# Patient Record
Sex: Male | Born: 1955 | Race: White | Hispanic: No | Marital: Married | Smoking: Former smoker
Health system: Southern US, Community
[De-identification: ages and names within clinical notes are randomized; demographics above are authoritative.]

## PROBLEM LIST (undated history)

## (undated) DIAGNOSIS — I10 Essential (primary) hypertension: Secondary | ICD-10-CM

## (undated) DIAGNOSIS — K7460 Unspecified cirrhosis of liver: Secondary | ICD-10-CM

## (undated) DIAGNOSIS — R188 Other ascites: Secondary | ICD-10-CM

## (undated) DIAGNOSIS — E7800 Pure hypercholesterolemia, unspecified: Secondary | ICD-10-CM

---

## 2013-10-06 ENCOUNTER — Emergency Department (HOSPITAL_COMMUNITY)
Admission: EM | Admit: 2013-10-06 | Discharge: 2013-10-06 | Disposition: A | Discharge status: Home / Self Care | Payer: BC Managed Care – PPO | Attending: Emergency Medicine | Admitting: Emergency Medicine

## 2013-10-06 ENCOUNTER — Encounter (HOSPITAL_COMMUNITY): Payer: Self-pay | Admitting: Emergency Medicine

## 2013-10-06 DIAGNOSIS — R188 Other ascites: Secondary | ICD-10-CM | POA: Insufficient documentation

## 2013-10-06 DIAGNOSIS — Z791 Long term (current) use of non-steroidal anti-inflammatories (NSAID): Secondary | ICD-10-CM | POA: Insufficient documentation

## 2013-10-06 DIAGNOSIS — E7800 Pure hypercholesterolemia, unspecified: Secondary | ICD-10-CM | POA: Insufficient documentation

## 2013-10-06 DIAGNOSIS — I10 Essential (primary) hypertension: Secondary | ICD-10-CM | POA: Insufficient documentation

## 2013-10-06 DIAGNOSIS — Z79899 Other long term (current) drug therapy: Secondary | ICD-10-CM | POA: Insufficient documentation

## 2013-10-06 DIAGNOSIS — R05 Cough: Secondary | ICD-10-CM | POA: Insufficient documentation

## 2013-10-06 DIAGNOSIS — R059 Cough, unspecified: Secondary | ICD-10-CM | POA: Insufficient documentation

## 2013-10-06 DIAGNOSIS — Z87891 Personal history of nicotine dependence: Secondary | ICD-10-CM | POA: Insufficient documentation

## 2013-10-06 DIAGNOSIS — K7031 Alcoholic cirrhosis of liver with ascites: Secondary | ICD-10-CM

## 2013-10-06 DIAGNOSIS — K7030 Alcoholic cirrhosis of liver without ascites: Secondary | ICD-10-CM | POA: Insufficient documentation

## 2013-10-06 HISTORY — DX: Pure hypercholesterolemia, unspecified: E78.00

## 2013-10-06 HISTORY — DX: Unspecified cirrhosis of liver: K74.60

## 2013-10-06 HISTORY — DX: Essential (primary) hypertension: I10

## 2013-10-06 HISTORY — DX: Other ascites: R18.8

## 2013-10-06 LAB — BODY FLUID CELL COUNT WITH DIFFERENTIAL
Lymphs, Fluid: 47 %
Monocyte-Macrophage-Serous Fluid: 46 % — ABNORMAL LOW (ref 50–90)
Neutrophil Count, Fluid: 7 % (ref 0–25)
Total Nucleated Cell Count, Fluid: 183 cu mm (ref 0–1000)

## 2013-10-06 LAB — CBC WITH DIFFERENTIAL/PLATELET
Basophils Absolute: 0.1 10*3/uL (ref 0.0–0.1)
Basophils Relative: 1 % (ref 0–1)
EOS ABS: 0.1 10*3/uL (ref 0.0–0.7)
Eosinophils Relative: 1 % (ref 0–5)
HCT: 34.6 % — ABNORMAL LOW (ref 39.0–52.0)
Hemoglobin: 12.1 g/dL — ABNORMAL LOW (ref 13.0–17.0)
LYMPHS ABS: 1.7 10*3/uL (ref 0.7–4.0)
Lymphocytes Relative: 18 % (ref 12–46)
MCH: 34.4 pg — AB (ref 26.0–34.0)
MCHC: 35 g/dL (ref 30.0–36.0)
MCV: 98.3 fL (ref 78.0–100.0)
Monocytes Absolute: 0.8 10*3/uL (ref 0.1–1.0)
Monocytes Relative: 8 % (ref 3–12)
NEUTROS ABS: 7.1 10*3/uL (ref 1.7–7.7)
NEUTROS PCT: 72 % (ref 43–77)
PLATELETS: 196 10*3/uL (ref 150–400)
RBC: 3.52 MIL/uL — AB (ref 4.22–5.81)
RDW: 13.9 % (ref 11.5–15.5)
WBC: 9.7 10*3/uL (ref 4.0–10.5)

## 2013-10-06 LAB — COMPREHENSIVE METABOLIC PANEL
ALBUMIN: 3.5 g/dL (ref 3.5–5.2)
ALK PHOS: 170 U/L — AB (ref 39–117)
ALT: 15 U/L (ref 0–53)
AST: 23 U/L (ref 0–37)
Anion gap: 13 (ref 5–15)
BILIRUBIN TOTAL: 0.9 mg/dL (ref 0.3–1.2)
BUN: 10 mg/dL (ref 6–23)
CHLORIDE: 94 meq/L — AB (ref 96–112)
CO2: 24 mEq/L (ref 19–32)
Calcium: 9.6 mg/dL (ref 8.4–10.5)
Creatinine, Ser: 0.59 mg/dL (ref 0.50–1.35)
GFR calc Af Amer: >90 mL/min (ref >90)
GFR calc non Af Amer: >90 mL/min (ref >90)
GLUCOSE: 102 mg/dL — AB (ref 70–99)
POTASSIUM: 4.1 meq/L (ref 3.7–5.3)
Sodium: 131 mEq/L — ABNORMAL LOW (ref 137–147)
Total Protein: 7 g/dL (ref 6.0–8.3)

## 2013-10-06 LAB — URINALYSIS, ROUTINE W REFLEX MICROSCOPIC
Bilirubin Urine: NEGATIVE
Glucose, UA: NEGATIVE mg/dL
Hgb urine dipstick: NEGATIVE
Ketones, ur: NEGATIVE mg/dL
Leukocytes, UA: NEGATIVE
Nitrite: NEGATIVE
PROTEIN: NEGATIVE mg/dL
Specific Gravity, Urine: 1.023 (ref 1.005–1.030)
UROBILINOGEN UA: 1 mg/dL (ref 0.0–1.0)
pH: 5.5 (ref 5.0–8.0)

## 2013-10-06 LAB — GRAM STAIN: SPECIAL REQUESTS: NORMAL

## 2013-10-06 LAB — ALBUMIN, FLUID (OTHER): Albumin, Fluid: 1.8 g/dL

## 2013-10-06 MED ORDER — LIDOCAINE-EPINEPHRINE 2 %-1:100000 IJ SOLN
INTRAMUSCULAR | Status: AC
  Administered 2013-10-06: 1 mL
  Filled 2013-10-06: qty 1

## 2013-10-06 NOTE — ED Notes (Signed)
[facility], state they will send someone to pick up Pt.

## 2013-10-06 NOTE — Discharge Instructions (Signed)
Ascites °Ascites is a gathering of fluid in the belly (abdomen). This is most often caused by liver disease. It may also be caused by a number of other less common problems. It causes a ballooning out (distension) of the abdomen. °CAUSES  °Scarring of the liver (cirrhosis) is the most common cause of ascites. Other causes include: °· Infection or inflammation in the abdomen. °· Cancer in the abdomen. °· Heart failure. °· Certain forms of kidney failure (nephritic syndrome). °· Inflammation of the pancreas. °· Clots in the veins of the liver. °SYMPTOMS  °In the early stages of ascites, you may not have any symptoms. The main symptom of ascites is a sense of abdominal bloating. This is due to the presence of fluid. This may also cause an increase in abdominal or waist size. People with this condition can develop swelling in the legs, and men can develop a swollen scrotum. When there is a lot of fluid, it may be hard to breath. Stretching of the abdomen by fluid can be painful. °DIAGNOSIS  °Certain features of your medical history, such as a history of liver disease and of an enlarging abdomen, can suggest the presence of ascites. The diagnosis of ascites can be made on physical exam by your caregiver. An abdominal ultrasound examination can confirm that ascites is present, and estimate the amount of fluid. °Once ascites is confirmed, it is important to determine its cause. Again, a history of one of the conditions listed in "CAUSES" provides a strong clue. A physical exam is important, and blood and X-ray tests may be needed. During a procedure called paracentesis, a sample of fluid is removed from the abdomen. This can determine certain key features about the fluid, such as whether or not infection or cancer is present. Your caregiver will determine if a paracentesis is necessary. They will describe the procedure to you. °PREVENTION  °Ascites is a complication of other conditions. Therefore to prevent ascites, you  must seek treatment for any significant health conditions you have. Once ascites is present, careful attention to fluid and salt intake may help prevent it from getting worse. If you have ascites, you should not drink alcohol. °PROGNOSIS  °The prognosis of ascites depends on the underlying disease. If the disease is reversible, such as with certain infections or with heart failure, then ascites may improve or disappear. When ascites is caused by cirrhosis, then it indicates that the liver disease has worsened, and further evaluation and treatment of the liver disease is needed. If your ascites is caused by cancer, then the success or failure of the cancer treatment will determine whether your ascites will improve or worsen. °RISKS AND COMPLICATIONS  °Ascites is likely to worsen if it is not properly diagnosed and treated. A large amount of ascites can cause pain and difficulty breathing. The main complication, besides worsening, is infection (called spontaneous bacterial peritonitis). This requires prompt treatment. °TREATMENT  °The treatment of ascites depends on its cause. When liver disease is your cause, medical management using water pills (diuretics) and decreasing salt intake is often effective. Ascites due to peritoneal inflammation or malignancy (cancer) alone does not respond to salt restriction and diuretics. Hospitalization is sometimes required. °If the treatment of ascites cannot be managed with medications, a number of other treatments are available. Your caregivers will help you decide which will work best for you. Some of these are: °· Removal of fluid from the abdomen (paracentesis). °· Fluid from the abdomen is passed into a vein (peritoneovenous shunting). °·   Liver transplantation.  Transjugular intrahepatic portosystemic stent shunt. HOME CARE INSTRUCTIONS  It is important to monitor body weight and the intake and output of fluids. Weigh yourself at the same time every day. Record your  weights. Fluid restriction may be necessary. It is also important to know your salt intake. The more salt you take in, the more fluid you will retain. Ninety percent of people with ascites respond to this approach.  Follow any directions for medicines carefully.  Follow up with your caregiver, as directed.  Report any changes in your health, especially any new or worsening symptoms.  If your ascites is from liver disease, avoid alcohol and other substances toxic to the liver. SEEK MEDICAL CARE IF:   Your weight increases more than a few pounds in a few days.  Your abdominal or waist size increases.  You develop swelling in your legs.  You had swelling and it worsens. SEEK IMMEDIATE MEDICAL CARE IF:   You develop a fever.  You develop new abdominal pain.  You develop difficulty breathing.  You develop confusion.  You have bleeding from the mouth, stomach, or rectum. MAKE SURE YOU:   Understand these instructions.  Will watch your condition.  Will get help right away if you are not doing well or get worse. Document Released: 02/19/2005 Document Revised: 05/14/2011 Document Reviewed: 09/20/2006 [facility] Patient Information 2015 [place], [place]. This information is not intended to replace advice given to you by your health care provider. Make sure you discuss any questions you have with your health care provider.  Cirrhosis Cirrhosis is a condition of scarring of the liver which is caused when the liver has tried repairing itself following damage. This damage may come from a previous infection such as one of the forms of hepatitis (usually hepatitis C), or the damage may come from being injured by toxins. The main toxin that causes this damage is alcohol. The scarring of the liver from use of alcohol is irreversible. That means the liver cannot return to normal even though alcohol is not used any more. The main danger of hepatitis C infection is that it may cause long-lasting  (chronic) liver disease, and this also may lead to cirrhosis. This complication is progressive and irreversible. CAUSES  Prior to available blood tests, hepatitis C could be contracted by blood transfusions. Since testing of blood has improved, this is now unlikely. This infection can also be contracted through intravenous drug use and the sharing of needles. It can also be contracted through sexual relationships. The injury caused by alcohol comes from too much use. It is not a few drinks that poison the liver, but years of misuse. Usually there will be some signs and symptoms early with scarring of the liver that suggest the development of better habits. Alcohol should never be used while using acetaminophen. A small dose of both taken together may cause irreversible damage to the liver. HOME CARE INSTRUCTIONS  There is no specific treatment for cirrhosis. However, there are things you can do to avoid making the condition worse.  Rest as needed.  Eat a well-balanced diet. Your caregiver can help you with suggestions.  Vitamin supplements including vitamins A, K, D, and thiamine can help.  A low-salt diet, water restriction, or diuretic medicine may be needed to reduce fluid retention.  Avoid alcohol. This can be extremely toxic if combined with acetaminophen.  Avoid drugs which are toxic to the liver. Some of these include isoniazid, methyldopa, acetaminophen, anabolic steroids (muscle-building drugs), erythromycin,  and oral contraceptives (birth control pills). Check with your caregiver to make sure medicines you are presently taking will not be harmful.  Periodic blood tests may be required. Follow your caregiver's advice regarding the timing of these.  Milk thistle is an herbal remedy which does protect the liver against toxins. However, it will not help once the liver has been scarred. SEEK MEDICAL CARE IF:  You have increasing fatigue or weakness.  You develop swelling of the hands,  feet, legs, or face.  You vomit bright red blood, or a coffee ground appearing material.  You have blood in your stools, or the stools turn black and tarry.  You have a fever.  You develop loss of appetite, or have nausea and vomiting.  You develop jaundice.  You develop easy bruising or bleeding.  You have worsening of any of the problems you are concerned about. Document Released: 02/19/2005 Document Revised: 05/14/2011 Document Reviewed: 10/08/2007 [facility] Patient Information 2015 [place], [place]. This information is not intended to replace advice given to you by your health care provider. Make sure you discuss any questions you have with your health care provider.   Emergency Department Resource Guide 1) Find a Doctor and Pay Out of Pocket Although you won't have to find out who is covered by your insurance plan, it is a good idea to ask around and get recommendations. You will then need to call the office and see if the doctor you have chosen will accept you as a new patient and what types of options they offer for patients who are self-pay. Some doctors offer discounts or will set up payment plans for their patients who do not have insurance, but you will need to ask so you aren't surprised when you get to your appointment.  2) Contact Your Local Health Department Not all health departments have doctors that can see patients for sick visits, but many do, so it is worth a call to see if yours does. If you don't know where your local health department is, you can check in your phone book. The CDC also has a tool to help you locate your state's health department, and many state websites also have listings of all of their local health departments.  3) Find a Walk-in Clinic If your illness is not likely to be very severe or complicated, you may want to try a walk in clinic. These are popping up all over the country in pharmacies, drugstores, and shopping centers. They're usually staffed  by nurse practitioners or physician assistants that have been trained to treat common illnesses and complaints. They're usually fairly quick and inexpensive. However, if you have serious medical issues or chronic medical problems, these are probably not your best option.  No Primary Care Doctor: - Call Health Connect at  [phone] - they can help you locate a primary care doctor that  accepts your insurance, provides certain services, etc. - Physician Referral Service- [phone]  Chronic Pain Problems: Organization         Address  Phone   Notes  [facility]  [phone] Patients need to be referred by their primary care doctor.   Medication Assistance: Organization         Address  Phone   Notes  [facility] Medication [facility] [address] [place], [place] [address] [phone] --Must be a resident of [facility] -- Must have NO insurance coverage whatsoever (no Medicaid/ Medicare, etc.) -- The pt.  MUST have a primary care doctor that directs their care regularly and follows them in the community   [facility]ist  [phone]   [employer]  [phone]    Agencies that provide inexpensive medical care: Organization         Address  Phone   Notes  [facility]  [phone]   [facility]    [phone]   [facility] [address] [place], [place] [address] [phone]   [facility] [address] [place] [place] [phone]   Planned Parenthood    [phone]   [facility]    [phone]   [facility]  [address], [place] Phone:  [phone], Fax:  [phone] Hours of Operation:  9 am - 6 pm, M-F.  Also accepts Medicaid/Medicare and self-pay.  [facility]  [address], [place] Phone: [phone], Fax: [phone]. Hours of Operation:   8:30 am - 5:30 pm, M-F.  Also accepts Medicaid and self-pay.  [facility] [place] [address], [place] Phone: [phone]   [facility] [address] Natasha Bence [place], [place] [phone], Ext. [phone] Mondays & Thursdays: 7-9 AM.  First 15 patients are seen on a first come, first serve basis.    Medicaid-accepting [facility] Providers:  Organization         Address  Phone   Notes  [facility] [address], [place] [phone] Also accepts self-pay patients.  [facility] [address] Laurell Josephs [place], [place]  [phone]   [facility] [address], [place] [phone]   [facility] [address], [place] [phone]   Renaye Rakers [address], [place]   [phone] Only accepts [place] patients after they have their name applied to their card.   Self-Pay (no insurance) in [facility]:  Organization         Address  Phone   Notes  Sickle Cell Patients, [facility] [address] [place], [place] [phone]   [facility] [address] [place], [place] [phone]   [facility]  [address], [place] [phone]   [facility]/Dr. Osei-Bonsu  [address], [place] or [address], [place] [phone] Phone number for both [place] and [place] locations is the same.  [facility] [address], [place] [phone]   [facility] [address], [place] or [address] [phone]   [facility] [address], [place] [phone], phone; [phone], fax Sees patients 1st and 3rd Saturday of every month.  Must not qualify for public or private insurance (i.e. Medicaid, Medicare, [place] Health  Choice, Veterans' Benefits)  Household income should be no more than 200% of the poverty level The clinic cannot treat you if you are pregnant or think you are pregnant  Sexually transmitted diseases are not treated at the clinic.    Dental Care: Organization         Address  Phone  Notes  [facility] of Public [facility] [address]  Lynne Logan [phone] Accepts children up to age 84 who are enrolled in Medicaid or [place] Health Choice; pregnant women with a Medicaid card; and children who have applied for Medicaid or [place] Health Choice, but were declined, whose parents can pay a reduced fee at time of service.  [facility] Department of [facility]  [address], [place] [phone] Accepts children up to age 92 who are enrolled in [place] or [place] Health Choice; pregnant women with a Medicaid card; and children who have applied for Medicaid or [place] Health Choice, but were declined, whose parents can pay a reduced fee at time of service.  [facility]  [address] [place], [place] [phone] Patients are seen by appointment only. Walk-ins are not accepted. [facility] will see patients 58 years of age and older. Monday - Tuesday (8am-5pm) Most Wednesdays (8:30-5pm) $30 per visit, cash only  [facility] Adult Dental Access PROGRAM  [address], [facility] [phone] Patients are seen by appointment only. Walk-ins are not accepted. [facility] will see patients 37 years of age and older. One Wednesday Evening (Monthly: Volunteer Based).  $30 per visit, cash only  [employer]  [phone] for adults; Children under age 26, call Graduate Pediatric Dentistry at [phone]. Children aged 29-14, please call [phone] to request a pediatric application.  Dental services are provided in all areas of dental care including fillings, crowns and bridges, complete  and partial dentures, implants, gum treatment, root canals, and extractions. Preventive care is also provided. Treatment is provided to both adults and children. Patients are selected via a lottery and there is often a waiting list.   [facility] [address] [place]  [phone] [email or web address]   [facility] [address] [place], [place] [phone], Ext. [phone] Second and Fourth Thursday of each month, opens at 6:30 AM; Clinic ends at 9 AM.  Patients are seen on a first-come first-served basis, and a limited number are seen during each clinic.   [facility]  [address] Ether Griffins [place], [place] [phone]   Eligibility Requirements You must have lived in [place], [place], or [place] counties for at least the last three months.   You cannot be eligible for state or federal sponsored [place], including [employer], [place], or [employer].   You generally cannot be eligible for healthcare insurance through your employer.    How to apply: Eligibility screenings are held every Tuesday and Wednesday afternoon from 1:00 pm until 4:00 pm. You do not need an appointment for the interview!  [facility] [address], [place], [place] [phone]   [facility] Health Department  [phone]   [facility]  [phone]   [facility] Health Department  [phone]    Behavioral Health Resources in the Community: Intensive Outpatient Programs Organization         Address  Phone  Notes  [facility] [address] [place], [place] [phone]   [facility] Outpatient [address] [place], [place] [phone]   [facility] [address], [place], [place]  [phone]   [facility] Mental Health [address],  [place], [place] [phone] or [phone]   Substance Abuse Resources Organization          Address  Phone  Notes  [facility] Drug  Services  [phone]   [facility]  [phone]   [facility]  [phone]   [facility]  [phone]   Residential & Outpatient Substance Abuse Program  [phone]   Psychological Services Organization         Address  Phone  Notes  [facility]  [phone]- [phone]   [facility]  [phone]- [phone]   [facility] [address], [place] [phone] or [phone]    Mobile Crisis Teams Organization         Address  Phone  Notes  [facility], Mobile Crisis Care Unit  [phone]   [facility]  [address] [place], [place] [phone]   Doristine LocksSharon DeEsch [address] [place] [phone]    Self-Help/Support Groups Organization         Address  Phone             Notes  [facility] [place] - variety of support groups  [phone]- [phone] Call for more information  Narcotics Anonymous (NA), [facility] [address], [place]  2 meetings at this location   Statisticianesidential Treatment Programs Organization         Address  Phone  Notes  [facility] [address],    [place]  [phone]   [facility]  [address], [place] [phone], [place], [place] [phone]   [facility] [address], [place] [phone] Admissions: 8am-3pm M-F  [facility] [address]    [place], [place] [phone]   [facility] [address], [place], [place] [phone]   [facility] [address]  [place], [place] [phone]   [facility] - Intensive Outpatient [address], [place], [place] [phone]   [facility] ([facility]iction [facility] [address]  [place], [place] [phone] or [phone]   [facility] ([facility]) [address] [place], [place]  [phone] Accepts Medicaid  [facility] [address]  [place] [phone] Substance Abuse/Addiction Treatment   [facility] Behavioral Health Resources Organization         Address  Phone  Notes  [facility]  [phone]   Angie FavaJulie Brannon, PhD [address], [address] [place], [place]   [phone] or [phone]   [facility]   [address] [place], [place] [phone]   [facility] [address], [place], [place] [phone]) [phone] Insurance/Medicaid/sponsorship through [facility]  [facility] [address]                                    [place], [place] [phone]) [phone] Therapy/tele-psych/case  [facility] [address]   [place], [place] [phone]    Dr. Lolly MustacheArfeen  [phone]   [facility]  [employer] [facility] 1) [address], [place] 2) [address], [place] 3)  [address], [place] [phone]  [phone]   [facility] Child Abuse Hotline [phone] or [phone]) [phone] (After Hours)

## 2013-10-06 NOTE — ED Provider Notes (Signed)
.  4:00 PM  Assumed care from Dr. Littie DeedsGentry.  Pt is a 58 y.o. M with history of alcohol abuse and cirrhosis who presents to the emergency department with abdominal distention, weight gain. No abdominal pain, fever. He stopped drinking alcohol 2 weeks ago and is currently in rehabilitation. Labs are unremarkable. Hemodynamically stable. He had a diagnostic and therapeutic tap removing approximately 3 L of peritoneal fluid. Fluid culture, Gram stain, cell count pending. Anticipate discharge back to his rehabilitation facility.    5:33 PM  Pt's peritoneal fluid analysis is unremarkable. Gram stain negative. Cell count unremarkable. Doubt SBP. We'll discharge back to [facility] where he is receiving alcohol rehabilitation. Discussed strict return precautions and importance for outpatient followup. Patient verbalizes understanding and is comfortable with plan.  Layla MawKristen N Reason Helzer, DO 10/06/13 1733

## 2013-10-06 NOTE — ED Notes (Signed)
Bed: WA02 Expected date:  Expected time:  Means of arrival:  Comments: EMS-ascites

## 2013-10-06 NOTE — ED Provider Notes (Signed)
CSN: [id number]     Arrival date & time 10/06/13  1312 History   First MD Initiated Contact with Patient 10/06/13 1319     Chief Complaint  Patient presents with  . Ascites     (Consider location/radiation/quality/duration/timing/severity/associated sxs/prior Treatment) Patient is a 58 y.o. male presenting with general illness.  Illness Location:  Abd Quality:  Swelling Severity:  Moderate Onset quality:  Gradual Duration:  1 week Timing:  Constant Progression:  Worsening Chronicity:  Recurrent Context:  Cirrhosis Relieved by:  Nothing Worsened by:  Nothing Associated symptoms: cough (dry, chronic)   Associated symptoms: no abdominal pain, no chest pain, no congestion, no diarrhea, no fever, no headaches, no nausea, no rash, no rhinorrhea, no shortness of breath, no sore throat and no vomiting     Past Medical History  Diagnosis Date  . Ascites   . Liver cirrhosis   . Hypertension   . High cholesterol    No past surgical history on file. No family history on file. History  Substance Use Topics  . Smoking status: Former Games developermoker  . Smokeless tobacco: Not on file  . Alcohol Use: No     Comment: Pt former drinker    Review of Systems  Constitutional: Negative for fever and chills.  HENT: Negative for congestion, rhinorrhea and sore throat.   Eyes: Negative for photophobia and visual disturbance.  Respiratory: Positive for cough (dry, chronic). Negative for shortness of breath.   Cardiovascular: Negative for chest pain and leg swelling.  Gastrointestinal: Negative for nausea, vomiting, abdominal pain, diarrhea and constipation.  Endocrine: Negative for polydipsia and polyuria.  Genitourinary: Negative for dysuria and hematuria.  Musculoskeletal: Negative for arthralgias and back pain.  Skin: Negative for color change and rash.  Neurological: Negative for dizziness, syncope, light-headedness and headaches.  Hematological: Negative for adenopathy. Does not bruise/bleed  easily.  All other systems reviewed and are negative.     Allergies  Review of patient's allergies indicates not on file.  Home Medications   Prior to Admission medications   Medication Sig Start Date End Date Taking? Authorizing Provider  allopurinol (ZYLOPRIM) 300 MG tablet Take 300 mg by mouth every morning.   Yes Historical Provider, MD  aluminum & magnesium hydroxide-simethicone (MYLANTA) 500-450-40 MG/5ML suspension Take 10-20 mLs by mouth every 6 (six) hours as needed for indigestion.   Yes Historical Provider, MD  Ascorbic Acid (VITAMIN C) 1000 MG tablet Take 1,000 mg by mouth every morning.   Yes Historical Provider, MD  benzonatate (TESSALON) 100 MG capsule Take 200 mg by mouth every 8 (eight) hours as needed for cough.   Yes Historical Provider, MD  cetirizine (ZYRTEC) 10 MG tablet Take 10 mg by mouth daily as needed (For itching or allergies.).   Yes Historical Provider, MD  diazepam (VALIUM) 5 MG/ML solution Take 10 mg by mouth See admin instructions. He is to take STAT for seizures and call MD.   Yes Historical Provider, MD  docusate sodium (COLACE) 100 MG capsule Take 100 mg by mouth 2 (two) times daily.   Yes Historical Provider, MD  gabapentin (NEURONTIN) 400 MG capsule Take 400 mg by mouth 3 (three) times daily.   Yes Historical Provider, MD  guaiFENesin (MUCINEX) 600 MG 12 hr tablet Take 600 mg by mouth 2 (two) times daily as needed (For congestion.).   Yes Historical Provider, MD  indomethacin (INDOCIN) 50 MG capsule Take 50 mg by mouth 2 (two) times daily with a meal.   Yes Historical Provider,  MD  lactulose (CHRONULAC) 10 GM/15ML solution Take 20 g by mouth at bedtime.   Yes Historical Provider, MD  loperamide (IMODIUM A-D) 2 MG tablet Take 2-4 mg by mouth as needed for diarrhea or loose stools. He takes two tablets, then one tablet following each diarrhea stool. Maximum 8 tablets per day.   Yes Historical Provider, MD  magnesium oxide (MAG-OX) 400 MG tablet Take 1,200  mg by mouth every morning.   Yes Historical Provider, MD  metoprolol (LOPRESSOR) 50 MG tablet Take 50 mg by mouth 2 (two) times daily.   Yes Historical Provider, MD  ondansetron (ZOFRAN) 8 MG tablet Take 8 mg by mouth 2 (two) times daily as needed for nausea or vomiting.   Yes Historical Provider, MD  OVER THE COUNTER MEDICATION Take 1 tablet by mouth every morning. Multivitamin with Folic Acid   Yes Historical Provider, MD  OVER THE COUNTER MEDICATION Take 15-30 mLs by mouth every 15 (fifteen) minutes as needed (For nausea until relief.). Nausea Control Liquid   Yes Historical Provider, MD  OVER THE COUNTER MEDICATION Take 1 lozenge by mouth every 4 (four) hours as needed (For sore throat.). Benzocaine Sore Throat Lozenges   Yes Historical Provider, MD  OVER THE COUNTER MEDICATION Take 1 tablet by mouth daily as needed (For constipation - alert MD after 4 consecutive doses.). Laxative of Choice   Yes Historical Provider, MD  simvastatin (ZOCOR) 20 MG tablet Take 20 mg by mouth at bedtime.   Yes Historical Provider, MD  spironolactone (ALDACTONE) 50 MG tablet Take 50 mg by mouth every morning.   Yes Historical Provider, MD  tamsulosin (FLOMAX) 0.4 MG CAPS capsule Take 0.4 mg by mouth every morning.   Yes Historical Provider, MD  thiamine 100 MG tablet Take 100 mg by mouth every morning.   Yes Historical Provider, MD  traZODone (DESYREL) 50 MG tablet Take 50 mg by mouth at bedtime as needed for sleep.   Yes Historical Provider, MD   BP 136/91  Pulse 90  Temp(Src) 98.1 F (36.7 C) (Oral)  Resp 18  SpO2 100% Physical Exam  Vitals reviewed. Constitutional: He is oriented to person, place, and time. He appears well-developed and well-nourished.  HENT:  Head: Normocephalic and atraumatic.  Eyes: Conjunctivae and EOM are normal.  Neck: Normal range of motion. Neck supple.  Cardiovascular: Normal rate, regular rhythm and normal heart sounds.   Pulmonary/Chest: Effort normal and breath sounds  normal. No respiratory distress.  Abdominal: He exhibits distension. There is no tenderness. There is no rebound and no guarding.  Musculoskeletal: Normal range of motion.  Neurological: He is alert and oriented to person, place, and time.  Skin: Skin is warm and dry.    ED Course  PARACENTESIS Date/Time: 10/06/2013 3:19 PM Performed by: Mirian Mo Authorized by: Mirian Mo Consent: written consent obtained. Risks and benefits: risks, benefits and alternatives were discussed Consent given by: patient Required items: required blood products, implants, devices, and special equipment available Patient identity confirmed: verbally with patient Initial or subsequent exam: initial Procedure purpose: therapeutic and diagnostic Indications: abdominal discomfort secondary to ascites Anesthesia: local infiltration Local anesthetic: lidocaine 1% with epinephrine Patient sedated: no Preparation: Patient was prepped and draped in the usual sterile fashion. Needle gauge: 18 Ultrasound guidance: yes Puncture site: left lower quadrant Fluid removed: 3000(ml) Fluid appearance: bilious Dressing: 4x4 sterile gauze Patient tolerance: Patient tolerated the procedure well with no immediate complications.   (including critical care time) Labs Review Labs Reviewed  CBC WITH DIFFERENTIAL - Abnormal; Notable for the following:    RBC 3.52 (*)    Hemoglobin 12.1 (*)    HCT 34.6 (*)    MCH 34.4 (*)    All other components within normal limits  URINALYSIS, ROUTINE W REFLEX MICROSCOPIC - Abnormal; Notable for the following:    Color, Urine AMBER (*)    All other components within normal limits  BODY FLUID CELL COUNT WITH DIFFERENTIAL - Abnormal; Notable for the following:    Monocyte-Macrophage-Serous Fluid 46 (*)    All other components within normal limits  COMPREHENSIVE METABOLIC PANEL - Abnormal; Notable for the following:    Sodium 131 (*)    Chloride 94 (*)    Glucose, Bld 102 (*)     Alkaline Phosphatase 170 (*)    All other components within normal limits  GRAM STAIN  BODY FLUID CULTURE  ALBUMIN, FLUID  CYTOLOGY - NON PAP    Imaging Review No results found.   EKG Interpretation None      MDM   Final diagnoses:  Ascites  Alcoholic cirrhosis of liver with ascites    58 y.o. male  with pertinent PMH of cirrhosis, prior ascites x 1 presents with recurrent ascities.  No infectious signs or pain.  No chest pain or dyspnea.  Pt is currently a pt of a rehab facility for ETOH.  No trauma.  Physical exam as above.  Paracentesis performed as above.  Clear fluid without signs of infection, and no abd tenderness on exam.  No blood loss per history.  Likely etiology of distension alone recurrent ascites due to cirrhosis.  Pt had relief after paracentesis.  DC to rehab facility after fluid results as above were unremarkable.    Labs and imaging as above reviewed.   1. Ascites   2. Alcoholic cirrhosis of liver with ascites         Mirian Mo, MD 10/07/13 [phone]

## 2013-10-06 NOTE — ED Notes (Signed)
Per EMS, pt noticed his abdomen swelling last night, which he noticed has gotten much larger this morning. Pt states he has gained 12 lbs over the past week. [facility]. Pt's last drink was ~2 weeks ago. [facility] [place]. Pt A&O in NAD.

## 2013-10-07 ENCOUNTER — Encounter (HOSPITAL_COMMUNITY): Payer: Self-pay | Admitting: Emergency Medicine

## 2013-10-07 ENCOUNTER — Emergency Department (HOSPITAL_COMMUNITY)
Admission: EM | Admit: 2013-10-07 | Discharge: 2013-10-07 | Disposition: A | Discharge status: Home / Self Care | Payer: BC Managed Care – PPO | Attending: Emergency Medicine | Admitting: Emergency Medicine

## 2013-10-07 DIAGNOSIS — Z8719 Personal history of other diseases of the digestive system: Secondary | ICD-10-CM | POA: Insufficient documentation

## 2013-10-07 DIAGNOSIS — K929 Disease of digestive system, unspecified: Secondary | ICD-10-CM | POA: Insufficient documentation

## 2013-10-07 DIAGNOSIS — Z87891 Personal history of nicotine dependence: Secondary | ICD-10-CM | POA: Insufficient documentation

## 2013-10-07 DIAGNOSIS — Y838 Other surgical procedures as the cause of abnormal reaction of the patient, or of later complication, without mention of misadventure at the time of the procedure: Secondary | ICD-10-CM | POA: Insufficient documentation

## 2013-10-07 DIAGNOSIS — Z5189 Encounter for other specified aftercare: Secondary | ICD-10-CM

## 2013-10-07 DIAGNOSIS — I10 Essential (primary) hypertension: Secondary | ICD-10-CM | POA: Insufficient documentation

## 2013-10-07 DIAGNOSIS — E7800 Pure hypercholesterolemia, unspecified: Secondary | ICD-10-CM | POA: Insufficient documentation

## 2013-10-07 DIAGNOSIS — Z79899 Other long term (current) drug therapy: Secondary | ICD-10-CM | POA: Insufficient documentation

## 2013-10-07 MED ORDER — SPIRONOLACTONE 50 MG PO TABS: 50.0000 mg | ORAL_TABLET | Freq: Two times a day (BID) | ORAL | Status: AC

## 2013-10-07 NOTE — Discharge Instructions (Signed)
°  Increase your spironolactone, to 50mg  twice a day. Have the suture removed from the abdominal wound in 7-10 days. Return here if needed for problems.     Sutured Wound Care Sutures are stitches that can be used to close wounds. Caring for your wound can help stop infection and lessen pain. HOME CARE   Rest and raise (elevate) the injured area until the pain and puffiness (swelling) go away.  Only take medicines as told by your doctor.  Clean the wound gently with mild soap and water once a day after the first 2 days. Rinse off the soap. Pat the area dry with a clean towel. Do not rub the wound.  Change the bandage (dressing) as told by your doctor. If the bandage sticks, soak it off with soapy water. Stop using a bandage after 2 days or after the wound stops leaking fluid.  Put cream on the wound as told by your doctor.  Do not stretch the wound.  Drink enough fluids to keep your pee (urine) clear or pale yellow.  See your doctor to have the sutures removed.  Use sunscreen or sunblock on the wound after it heals. GET HELP RIGHT AWAY IF:   Your wound gets red, puffy, hot, or tender.  You have more pain in the wound.  You have a red streak that goes away from the wound.  You see yellowish-white fluid (pus) coming out of the wound.  You have a fever.  You have chills and start to shake.  You notice a bad smell coming from the wound.  Your wound will not stop bleeding. MAKE SURE YOU:   Understand these instructions.  Will watch your condition.  Will get help right away if you are not doing well or get worse. Document Released: 08/08/2007 Document Revised: 05/14/2011 Document Reviewed: 06/25/2010 [facility] Patient Information 2015 [employer] [place]. This information is not intended to replace advice given to you by your health care provider. Make sure you discuss any questions you have with your health care provider.

## 2013-10-07 NOTE — ED Notes (Signed)
Patient was seen and treated yesterday for ascites and had 3 liters pulled off  - now dressing is off and the site is leaking. Denies any pain or discomfort however the abdominal distention is worse.

## 2013-10-07 NOTE — ED Provider Notes (Signed)
CSN: [id number]     Arrival date & time 10/07/13  1345 History   First MD Initiated Contact with Patient 10/07/13 1414     Chief Complaint  Patient presents with  . Abdominal Pain    swelling, leaking     (Consider location/radiation/quality/duration/timing/severity/associated sxs/prior Treatment) HPI  Shawn Frank is a 58 y.o. male is here d/t leaking from his paracentesis site. He was drained yesterday in the ED. He denies abdominal pain, fever, N/V. He is in Rehab for Alcohol detox. No other known modifying factors.  Past Medical History  Diagnosis Date  . Ascites   . Liver cirrhosis   . Hypertension   . High cholesterol    History reviewed. No pertinent past surgical history. History reviewed. No pertinent family history. History  Substance Use Topics  . Smoking status: Former Games developermoker  . Smokeless tobacco: Not on file  . Alcohol Use: No     Comment: Pt former drinker    Review of Systems    Allergies  Review of patient's allergies indicates no known allergies.  Home Medications   Prior to Admission medications   Medication Sig Start Date End Date Taking? Authorizing Provider  allopurinol (ZYLOPRIM) 300 MG tablet Take 300 mg by mouth every morning.   Yes Historical Provider, MD  aluminum & magnesium hydroxide-simethicone (MYLANTA) 500-450-40 MG/5ML suspension Take 10-20 mLs by mouth every 6 (six) hours as needed for indigestion.   Yes Historical Provider, MD  Ascorbic Acid (VITAMIN C) 1000 MG tablet Take 1,000 mg by mouth every morning.   Yes Historical Provider, MD  benzonatate (TESSALON) 100 MG capsule Take 200 mg by mouth every 8 (eight) hours as needed for cough.   Yes Historical Provider, MD  cetirizine (ZYRTEC) 10 MG tablet Take 10 mg by mouth daily as needed (For itching or allergies.).   Yes Historical Provider, MD  docusate sodium (COLACE) 100 MG capsule Take 100 mg by mouth 2 (two) times daily.   Yes Historical Provider, MD  gabapentin (NEURONTIN) 400  MG capsule Take 400 mg by mouth 3 (three) times daily.   Yes Historical Provider, MD  guaiFENesin (MUCINEX) 600 MG 12 hr tablet Take 600 mg by mouth 2 (two) times daily as needed (For congestion.).   Yes Historical Provider, MD  indomethacin (INDOCIN) 50 MG capsule Take 50 mg by mouth 2 (two) times daily with a meal.   Yes Historical Provider, MD  lactulose (CHRONULAC) 10 GM/15ML solution Take 20 g by mouth at bedtime.   Yes Historical Provider, MD  loperamide (IMODIUM A-D) 2 MG tablet Take 2-4 mg by mouth as needed for diarrhea or loose stools. He takes two tablets, then one tablet following each diarrhea stool. Maximum 8 tablets per day.   Yes Historical Provider, MD  magnesium oxide (MAG-OX) 400 MG tablet Take 1,200 mg by mouth every morning.   Yes Historical Provider, MD  metoprolol (LOPRESSOR) 50 MG tablet Take 50 mg by mouth 2 (two) times daily.   Yes Historical Provider, MD  ondansetron (ZOFRAN) 8 MG tablet Take 8 mg by mouth 2 (two) times daily as needed for nausea or vomiting.   Yes Historical Provider, MD  simvastatin (ZOCOR) 20 MG tablet Take 20 mg by mouth at bedtime.   Yes Historical Provider, MD  tamsulosin (FLOMAX) 0.4 MG CAPS capsule Take 0.4 mg by mouth every morning.   Yes Historical Provider, MD  thiamine 100 MG tablet Take 100 mg by mouth every morning.   Yes Historical Provider, MD  traZODone (DESYREL) 50 MG tablet Take 50 mg by mouth at bedtime as needed for sleep.   Yes Historical Provider, MD  spironolactone (ALDACTONE) 50 MG tablet Take 1 tablet (50 mg total) by mouth 2 (two) times daily. 10/07/13   Flint Melter, MD   BP 117/83  Pulse 76  Temp(Src) 98.4 F (36.9 C) (Oral)  Resp 18  SpO2 96% Physical Exam  Nursing note and vitals reviewed. Constitutional: He is oriented to person, place, and time. He appears well-developed and well-nourished.  HENT:  Head: Normocephalic and atraumatic.  Right Ear: External ear normal.  Left Ear: External ear normal.  Eyes:  Conjunctivae and EOM are normal. Pupils are equal, round, and reactive to light.  Neck: Normal range of motion and phonation normal. Neck supple.  Cardiovascular: Normal rate.   Pulmonary/Chest: Effort normal. He exhibits no bony tenderness.  Abdominal: Soft. There is no tenderness.  Puncture site LLQ, is draining ascitic fluid. No abdominal tenderness. No erythema around wound.  Musculoskeletal: Normal range of motion.  Neurological: He is alert and oriented to person, place, and time. No cranial nerve deficit or sensory deficit. He exhibits normal muscle tone. Coordination normal.  Skin: Skin is warm, dry and intact.  Psychiatric: He has a normal mood and affect. His behavior is normal. Judgment and thought content normal.    ED Course  Procedures (including critical care time)  LACERATION REPAIR- Puncture wound draining abdominal ascites Performed by: Flint Melter Consent: Verbal consent obtained. Risks and benefits: risks, benefits and alternatives were discussed Patient identity confirmed: provided demographic data Time out performed prior to procedure Prepped and Draped in normal sterile fashion Wound explored Laceration Location: left LQ abdomen Laceration Length:0.3 cm No Foreign Bodies seen or palpated Anesthesia: local infiltration Local anesthetic: lidocaine  1% without epinephrine Anesthetic total: 4 ml Irrigation method: syringe Amount of cleaning: standard Skin closure: 4-0 prolene Number of sutures or staples: 1 Technique: purse stitch Patient tolerance: Patient tolerated the procedure well with no immediate complications. The ascitic fluid drainage stopped. Pressure dressing applied    Labs Review Labs Reviewed - No data to display  Imaging Review No results found.   EKG Interpretation None      MDM   Final diagnoses:  Encounter for wound re-check    Draining abdominal wound puncture site. Doubt SBP. No indication for further  treatment.   Nursing Notes Reviewed/ Care Coordinated Applicable Imaging Reviewed Interpretation of Laboratory Data incorporated into ED treatment  The patient appears reasonably screened and/or stabilized for discharge and I doubt any other medical condition or other [facility] requiring further screening, evaluation, or treatment in the ED at this time prior to discharge.  Plan: Home Medications- Increase Aldactone to BID; Home Treatments- wound care; return here if the recommended treatment, does not improve the symptoms; Recommended follow up- Suture out 7-10 days   Flint Melter, MD 10/07/13 [phone]

## 2013-10-07 NOTE — ED Notes (Signed)
Bed: WA21 Expected date:  Expected time:  Means of arrival:  Comments: EMS abd. Swelling/paracentesis yest.

## 2013-10-07 NOTE — Progress Notes (Signed)
Orthopedic Tech Progress Note Patient Details:  Shawn OregonChristopher Frank 10/20/1955 [id number] Delivered abdominal binder to pt.'s nurse. Ortho Devices Type of Ortho Device: Abdominal binder Ortho Device/Splint Interventions: Other (comment)   Lesle ChrisGilliland, Deedee Lybarger L 10/07/2013, 4:09 PM

## 2013-10-09 LAB — BODY FLUID CULTURE
Culture: NO GROWTH
Special Requests: NORMAL

## 2013-10-15 ENCOUNTER — Encounter (HOSPITAL_COMMUNITY): Payer: Self-pay | Admitting: Emergency Medicine

## 2013-10-15 ENCOUNTER — Emergency Department (HOSPITAL_COMMUNITY)
Admission: EM | Admit: 2013-10-15 | Discharge: 2013-10-15 | Disposition: A | Discharge status: Home / Self Care | Payer: BC Managed Care – PPO | Attending: Emergency Medicine | Admitting: Emergency Medicine

## 2013-10-15 DIAGNOSIS — F17200 Nicotine dependence, unspecified, uncomplicated: Secondary | ICD-10-CM | POA: Diagnosis not present

## 2013-10-15 DIAGNOSIS — Z8639 Personal history of other endocrine, nutritional and metabolic disease: Secondary | ICD-10-CM | POA: Diagnosis not present

## 2013-10-15 DIAGNOSIS — F1010 Alcohol abuse, uncomplicated: Secondary | ICD-10-CM | POA: Insufficient documentation

## 2013-10-15 DIAGNOSIS — Z862 Personal history of diseases of the blood and blood-forming organs and certain disorders involving the immune mechanism: Secondary | ICD-10-CM | POA: Insufficient documentation

## 2013-10-15 DIAGNOSIS — Z79899 Other long term (current) drug therapy: Secondary | ICD-10-CM | POA: Insufficient documentation

## 2013-10-15 DIAGNOSIS — Z8719 Personal history of other diseases of the digestive system: Secondary | ICD-10-CM | POA: Diagnosis not present

## 2013-10-15 DIAGNOSIS — R188 Other ascites: Secondary | ICD-10-CM | POA: Diagnosis present

## 2013-10-15 DIAGNOSIS — I10 Essential (primary) hypertension: Secondary | ICD-10-CM | POA: Diagnosis not present

## 2013-10-15 DIAGNOSIS — Z87891 Personal history of nicotine dependence: Secondary | ICD-10-CM | POA: Insufficient documentation

## 2013-10-15 LAB — CBC WITH DIFFERENTIAL/PLATELET
BASOS PCT: 0 % (ref 0–1)
Basophils Absolute: 0 10*3/uL (ref 0.0–0.1)
Eosinophils Absolute: 0.1 10*3/uL (ref 0.0–0.7)
Eosinophils Relative: 1 % (ref 0–5)
HCT: 35.2 % — ABNORMAL LOW (ref 39.0–52.0)
Hemoglobin: 12.3 g/dL — ABNORMAL LOW (ref 13.0–17.0)
Lymphocytes Relative: 17 % (ref 12–46)
Lymphs Abs: 2 10*3/uL (ref 0.7–4.0)
MCH: 34.1 pg — AB (ref 26.0–34.0)
MCHC: 34.9 g/dL (ref 30.0–36.0)
MCV: 97.5 fL (ref 78.0–100.0)
MONOS PCT: 10 % (ref 3–12)
Monocytes Absolute: 1.2 10*3/uL — ABNORMAL HIGH (ref 0.1–1.0)
Neutro Abs: 8.3 10*3/uL — ABNORMAL HIGH (ref 1.7–7.7)
Neutrophils Relative %: 72 % (ref 43–77)
PLATELETS: 234 10*3/uL (ref 150–400)
RBC: 3.61 MIL/uL — ABNORMAL LOW (ref 4.22–5.81)
RDW: 13.7 % (ref 11.5–15.5)
WBC: 11.5 10*3/uL — ABNORMAL HIGH (ref 4.0–10.5)

## 2013-10-15 LAB — COMPREHENSIVE METABOLIC PANEL
ALK PHOS: 185 U/L — AB (ref 39–117)
ALT: 22 U/L (ref 0–53)
AST: 25 U/L (ref 0–37)
Albumin: 3.3 g/dL — ABNORMAL LOW (ref 3.5–5.2)
Anion gap: 14 (ref 5–15)
BILIRUBIN TOTAL: 0.9 mg/dL (ref 0.3–1.2)
BUN: 6 mg/dL (ref 6–23)
CHLORIDE: 94 meq/L — AB (ref 96–112)
CO2: 26 mEq/L (ref 19–32)
Calcium: 9.5 mg/dL (ref 8.4–10.5)
Creatinine, Ser: 0.55 mg/dL (ref 0.50–1.35)
Glucose, Bld: 92 mg/dL (ref 70–99)
Potassium: 4.1 mEq/L (ref 3.7–5.3)
Sodium: 134 mEq/L — ABNORMAL LOW (ref 137–147)
Total Protein: 6.9 g/dL (ref 6.0–8.3)

## 2013-10-15 LAB — PROTIME-INR
INR: 1.14 (ref 0.00–1.49)
PROTHROMBIN TIME: 14.6 s (ref 11.6–15.2)

## 2013-10-15 LAB — APTT: APTT: 38 s — AB (ref 24–37)

## 2013-10-15 NOTE — ED Notes (Signed)
Pt presents with c/o ascites. Pt says that he was seen here 2 weeks ago and had approx 2-3 liters drained off of his stomach. Pt reports that [facility] because of the abdominal swelling that he has noticed over the last 2 days. NAD at this time.

## 2013-10-15 NOTE — Discharge Instructions (Signed)
Return tomorrow to radiology to get your outpatient drainage of the fluid in your abdomen. You appointment is at 3 pm which was the earliest one they had. Arrive 15 minutes early. Return to the ED if you get fever, abdominal pain or feel short of breath.    Ascites Ascites is a gathering of fluid in the belly (abdomen). This is most often caused by liver disease. It may also be caused by a number of other less common problems. It causes a ballooning out (distension) of the abdomen. CAUSES  Scarring of the liver (cirrhosis) is the most common cause of ascites. Other causes include:  Infection or inflammation in the abdomen.  Cancer in the abdomen.  Heart failure.  Certain forms of kidney failure (nephritic syndrome).  Inflammation of the pancreas.  Clots in the veins of the liver. SYMPTOMS  In the early stages of ascites, you may not have any symptoms. The main symptom of ascites is a sense of abdominal bloating. This is due to the presence of fluid. This may also cause an increase in abdominal or waist size. People with this condition can develop swelling in the legs, and men can develop a swollen scrotum. When there is a lot of fluid, it may be hard to breath. Stretching of the abdomen by fluid can be painful. DIAGNOSIS  Certain features of your medical history, such as a history of liver disease and of an enlarging abdomen, can suggest the presence of ascites. The diagnosis of ascites can be made on physical exam by your caregiver. An abdominal ultrasound examination can confirm that ascites is present, and estimate the amount of fluid. Once ascites is confirmed, it is important to determine its cause. Again, a history of one of the conditions listed in "CAUSES" provides a strong clue. A physical exam is important, and blood and X-ray tests may be needed. During a procedure called paracentesis, a sample of fluid is removed from the abdomen. This can determine certain key features about the  fluid, such as whether or not infection or cancer is present. Your caregiver will determine if a paracentesis is necessary. They will describe the procedure to you. PREVENTION  Ascites is a complication of other conditions. Therefore to prevent ascites, you must seek treatment for any significant health conditions you have. Once ascites is present, careful attention to fluid and salt intake may help prevent it from getting worse. If you have ascites, you should not drink alcohol. PROGNOSIS  The prognosis of ascites depends on the underlying disease. If the disease is reversible, such as with certain infections or with heart failure, then ascites may improve or disappear. When ascites is caused by cirrhosis, then it indicates that the liver disease has worsened, and further evaluation and treatment of the liver disease is needed. If your ascites is caused by cancer, then the success or failure of the cancer treatment will determine whether your ascites will improve or worsen. RISKS AND COMPLICATIONS  Ascites is likely to worsen if it is not properly diagnosed and treated. A large amount of ascites can cause pain and difficulty breathing. The main complication, besides worsening, is infection (called spontaneous bacterial peritonitis). This requires prompt treatment. TREATMENT  The treatment of ascites depends on its cause. When liver disease is your cause, medical management using water pills (diuretics) and decreasing salt intake is often effective. Ascites due to peritoneal inflammation or malignancy (cancer) alone does not respond to salt restriction and diuretics. Hospitalization is sometimes required. If the treatment  of ascites cannot be managed with medications, a number of other treatments are available. Your caregivers will help you decide which will work best for you. Some of these are:  Removal of fluid from the abdomen (paracentesis).  Fluid from the abdomen is passed into a vein  (peritoneovenous shunting).  Liver transplantation.  Transjugular intrahepatic portosystemic stent shunt. HOME CARE INSTRUCTIONS  It is important to monitor body weight and the intake and output of fluids. Weigh yourself at the same time every day. Record your weights. Fluid restriction may be necessary. It is also important to know your salt intake. The more salt you take in, the more fluid you will retain. Ninety percent of people with ascites respond to this approach.  Follow any directions for medicines carefully.  Follow up with your caregiver, as directed.  Report any changes in your health, especially any new or worsening symptoms.  If your ascites is from liver disease, avoid alcohol and other substances toxic to the liver. SEEK MEDICAL CARE IF:   Your weight increases more than a few pounds in a few days.  Your abdominal or waist size increases.  You develop swelling in your legs.  You had swelling and it worsens. SEEK IMMEDIATE MEDICAL CARE IF:   You develop a fever.  You develop new abdominal pain.  You develop difficulty breathing.  You develop confusion.  You have bleeding from the mouth, stomach, or rectum. MAKE SURE YOU:   Understand these instructions.  Will watch your condition.  Will get help right away if you are not doing well or get worse. Document Released: 02/19/2005 Document Revised: 05/14/2011 Document Reviewed: 09/20/2006 [facility] Patient Information 2015 [place], [place]. This information is not intended to replace advice given to you by your health care provider. Make sure you discuss any questions you have with your health care provider.

## 2013-10-15 NOTE — ED Provider Notes (Signed)
CSN: [id number]     Arrival date & time 10/15/13  1608 History   First MD Initiated Contact with Patient 10/15/13 1656     Chief Complaint  Patient presents with  . Ascites     (Consider location/radiation/quality/duration/timing/severity/associated sxs/prior Treatment) HPI Patient reports he has been in [facility] and has another week and a half. He has a problem with alcohol abuse and states he had ascites a few years ago. He also had it on August 4 and came to the ED and had a paracentesis done in the ED. He had to return the following day because he continued to have leakage. He had a suture placed however he said it was still was leaking afterward. He states yesterday he started having the swelling return. He states it's much larger today. He states his abdomen feels tight and uncomfortable but he does not have pain. He denies shortness of breath or swelling of his extremities. He denies nausea or vomiting.  PCP in [place], [place]  Past Medical History  Diagnosis Date  . Ascites   . Liver cirrhosis   . Hypertension   . High cholesterol    History reviewed. No pertinent past surgical history. No family history on file. History  Substance Use Topics  . Smoking status: Former Games developer  . Smokeless tobacco: Not on file  . Alcohol Use: No     Comment: Pt former drinker  retired Lives with spouse Drinking 6-5 beers + few vodka shots daily for 2 years  Review of Systems  All other systems reviewed and are negative.     Allergies  Review of patient's allergies indicates no known allergies.  Home Medications   Prior to Admission medications   Medication Sig Start Date End Date Taking? Authorizing Provider  allopurinol (ZYLOPRIM) 300 MG tablet Take 300 mg by mouth every morning.   Yes Historical Provider, MD  aluminum & magnesium hydroxide-simethicone (MYLANTA) 500-450-40 MG/5ML suspension Take 10-20 mLs by mouth every 6 (six) hours as needed for indigestion.   Yes  Historical Provider, MD  Ascorbic Acid (VITAMIN C) 1000 MG tablet Take 1,000 mg by mouth every morning.   Yes Historical Provider, MD  ascorbic acid (VITAMIN C) 1000 MG tablet Take 1,000 mg by mouth daily.   Yes Historical Provider, MD  benzocaine-menthol (CHLORAEPTIC) 6-10 MG lozenge Take 1 lozenge by mouth every 4 (four) hours as needed for sore throat.   Yes Historical Provider, MD  benzonatate (TESSALON) 100 MG capsule Take 200 mg by mouth every 8 (eight) hours as needed for cough.   Yes Historical Provider, MD  cetirizine (ZYRTEC) 10 MG tablet Take 10 mg by mouth daily as needed (For itching or allergies.).   Yes Historical Provider, MD  gabapentin (NEURONTIN) 400 MG capsule Take 400 mg by mouth 3 (three) times daily.   Yes Historical Provider, MD  guaiFENesin (MUCINEX) 600 MG 12 hr tablet Take 600 mg by mouth 2 (two) times daily as needed (For congestion.).   Yes Historical Provider, MD  lactulose (CHRONULAC) 10 GM/15ML solution Take 20 g by mouth at bedtime.   Yes Historical Provider, MD  loperamide (IMODIUM A-D) 2 MG tablet Take 2-4 mg by mouth as needed for diarrhea or loose stools. He takes two tablets, then one tablet following each diarrhea stool. Maximum 8 tablets per day.   Yes Historical Provider, MD  magnesium oxide (MAG-OX) 400 MG tablet Take 1,200 mg by mouth every morning.   Yes Historical Provider, MD  metoprolol (LOPRESSOR)  50 MG tablet Take 50 mg by mouth 2 (two) times daily.   Yes Historical Provider, MD  Multiple Vitamin (MULTIVITAMIN WITH MINERALS) TABS tablet Take 1 tablet by mouth daily. With folic acid.   Yes Historical Provider, MD  ondansetron (ZOFRAN) 8 MG tablet Take 8 mg by mouth 2 (two) times daily as needed for nausea or vomiting.   Yes Historical Provider, MD  spironolactone (ALDACTONE) 50 MG tablet Take 1 tablet (50 mg total) by mouth 2 (two) times daily. 10/07/13  Yes Flint Melter, MD  tamsulosin (FLOMAX) 0.4 MG CAPS capsule Take 0.4 mg by mouth every morning.    Yes Historical Provider, MD  thiamine 100 MG tablet Take 100 mg by mouth every morning.   Yes Historical Provider, MD  traZODone (DESYREL) 50 MG tablet Take 50 mg by mouth at bedtime as needed for sleep.   Yes Historical Provider, MD   BP 122/88  Pulse 87  Temp(Src) 98.6 F (37 C) (Oral)  Resp 16  SpO2 99%  Vital signs normal   Physical Exam  Nursing note and vitals reviewed. Constitutional: He is oriented to person, place, and time. He appears well-developed and well-nourished.  Non-toxic appearance. He does not appear ill. No distress.  HENT:  Head: Normocephalic and atraumatic.  Right Ear: External ear normal.  Left Ear: External ear normal.  Nose: Nose normal. No mucosal edema or rhinorrhea.  Mouth/Throat: Oropharynx is clear and moist and mucous membranes are normal. No dental abscesses or uvula swelling.  Eyes: Conjunctivae and EOM are normal. Pupils are equal, round, and reactive to light.  Neck: Normal range of motion and full passive range of motion without pain. Neck supple.  Cardiovascular: Normal rate, regular rhythm and normal heart sounds.  Exam reveals no gallop and no friction rub.   No murmur heard. Pulmonary/Chest: Effort normal and breath sounds normal. No respiratory distress. He has no wheezes. He has no rhonchi. He has no rales. He exhibits no tenderness and no crepitus.  Abdominal: Soft. Normal appearance and bowel sounds are normal. He exhibits distension. There is no tenderness. There is no rebound and no guarding.  Abdomen is distended, but is still very soft to palpation. His umbilicus is protruding.   Musculoskeletal: Normal range of motion. He exhibits no edema and no tenderness.  Moves all extremities well.   Neurological: He is alert and oriented to person, place, and time. He has normal strength. No cranial nerve deficit.  No tremor  Skin: Skin is warm, dry and intact. No rash noted. No erythema. No pallor.  Large ecchymotic lesions on his forearms  from a prior fall  Psychiatric: He has a normal mood and affect. His speech is normal and behavior is normal. His mood appears not anxious.    ED Course  Procedures (including critical care time)  I called [place] and the next available appointment is tomorrow at 3 pm tomorrow, arrive 15 minutes early. Pt already had testing done on the fluid his last ED visit. Pt states he is going to see his PCP when he returns to [place] and I have advised him to get a referral to a gastroenterologist.   Labs Review Results for orders placed during the hospital encounter of 10/15/13  PROTIME-INR      Result Value Ref Range   Prothrombin Time 14.6  11.6 - 15.2 seconds   INR 1.14  0.00 - 1.49  APTT      Result Value Ref Range   aPTT 38 (*)  24 - 37 seconds  COMPREHENSIVE METABOLIC PANEL      Result Value Ref Range   Sodium 134 (*) 137 - 147 mEq/L   Potassium 4.1  3.7 - 5.3 mEq/L   Chloride 94 (*) 96 - 112 mEq/L   CO2 26  19 - 32 mEq/L   Glucose, Bld 92  70 - 99 mg/dL   BUN 6  6 - 23 mg/dL   Creatinine, Ser 1.610.55  0.50 - 1.35 mg/dL   Calcium 9.5  8.4 - 09.610.5 mg/dL   Total Protein 6.9  6.0 - 8.3 g/dL   Albumin 3.3 (*) 3.5 - 5.2 g/dL   AST 25  0 - 37 U/L   ALT 22  0 - 53 U/L   Alkaline Phosphatase 185 (*) 39 - 117 U/L   Total Bilirubin 0.9  0.3 - 1.2 mg/dL   GFR calc non Af Amer >90  >90 mL/min   GFR calc Af Amer >90  >90 mL/min   Anion gap 14  5 - 15  CBC WITH DIFFERENTIAL      Result Value Ref Range   WBC 11.5 (*) 4.0 - 10.5 K/uL   RBC 3.61 (*) 4.22 - 5.81 MIL/uL   Hemoglobin 12.3 (*) 13.0 - 17.0 g/dL   HCT 04.535.2 (*) 40.939.0 - 81.152.0 %   MCV 97.5  78.0 - 100.0 fL   MCH 34.1 (*) 26.0 - 34.0 pg   MCHC 34.9  30.0 - 36.0 g/dL   RDW 91.413.7  78.211.5 - 95.615.5 %   Platelets 234  150 - 400 K/uL   Neutrophils Relative % 72  43 - 77 %   Neutro Abs 8.3 (*) 1.7 - 7.7 K/uL   Lymphocytes Relative 17  12 - 46 %   Lymphs Abs 2.0  0.7 - 4.0 K/uL   Monocytes Relative 10  3 - 12 %   Monocytes Absolute 1.2 (*) 0.1 -  1.0 K/uL   Eosinophils Relative 1  0 - 5 %   Eosinophils Absolute 0.1  0.0 - 0.7 K/uL   Basophils Relative 0  0 - 1 %   Basophils Absolute 0.0  0.0 - 0.1 K/uL   Laboratory interpretation all normal except mild leukocytosis, mild anemia     Imaging Review No results found.   EKG Interpretation None      MDM   Final diagnoses:  Ascites    Plan discharge   Devoria AlbeIva Gracen Southwell, MD, Franz DellFACEP     Marlie Kuennen L Zuleika Gallus, MD 10/15/13 2251

## 2013-10-15 NOTE — ED Notes (Signed)
Pt reports ascites.  First noticed it developing a couple days ago.  Denies abd pain and difficulty breathing.  Pt reports hx of liver disease.  Pt states he had ascites 2 weeks ago and had it drained in the ED.  No acute distress, respirations equal and unlabored, skin warm and dry.

## 2013-10-16 ENCOUNTER — Ambulatory Visit (HOSPITAL_COMMUNITY)
Admit: 2013-10-16 | Discharge: 2013-10-16 | Disposition: A | Discharge status: Home / Self Care | Payer: BC Managed Care – PPO | Source: Ambulatory Visit | Attending: Emergency Medicine | Admitting: Emergency Medicine

## 2013-10-16 DIAGNOSIS — R188 Other ascites: Secondary | ICD-10-CM | POA: Insufficient documentation

## 2013-10-16 NOTE — Procedures (Signed)
US guided therapeutic paracentesis performed yielding 4.1 liters yellow fluid. No immediate complications. 

## 2013-12-03 DEATH — deceased

## 2015-04-22 IMAGING — US US PARACENTESIS
1 series · 5 of 5 positions shown · non-contrast
Comparison: None.

CLINICAL DATA: Cirrhosis, ascites. Request is made for therapeutic
paracentesis.

EXAM:
ULTRASOUND GUIDED THERAPEUTIC PARACENTESIS

[Series 1: us paracentesis · 0.27mm/px · 5 of 5 slices shown]
[im 1/5]
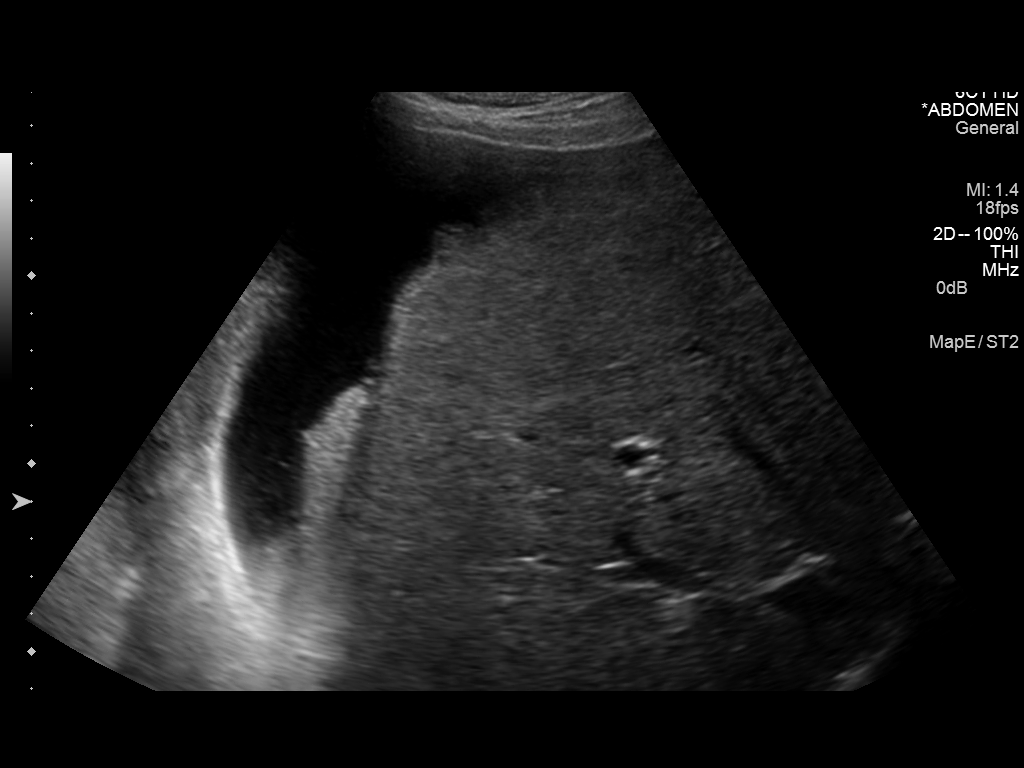
[im 2/5]
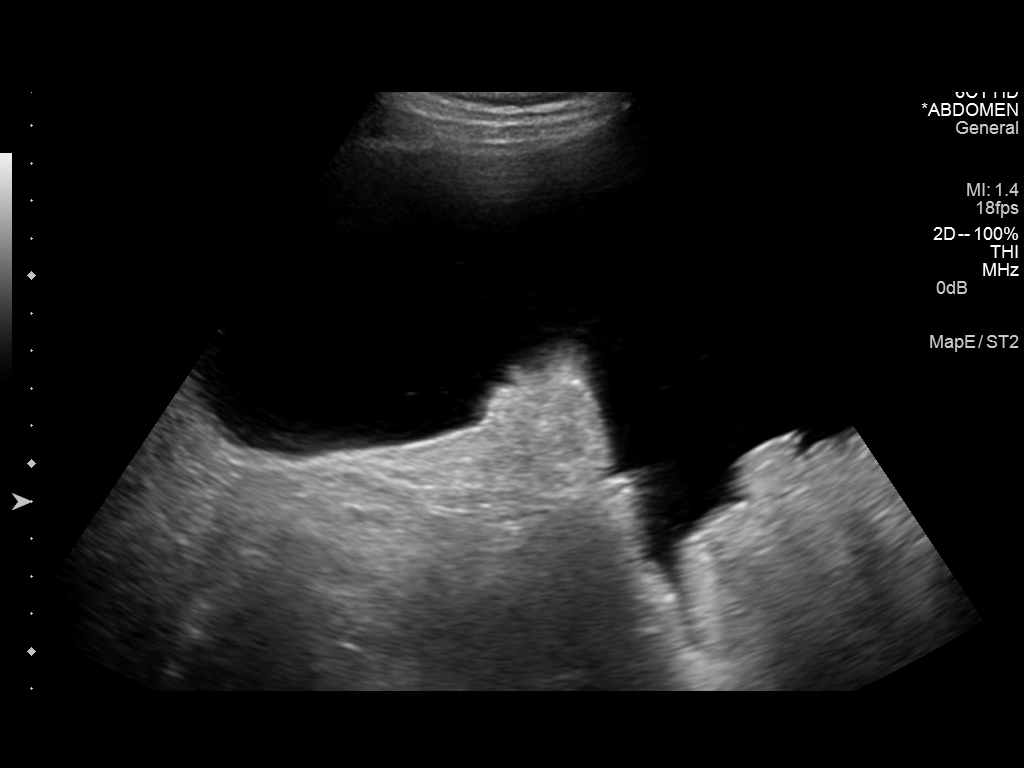
[im 3/5]
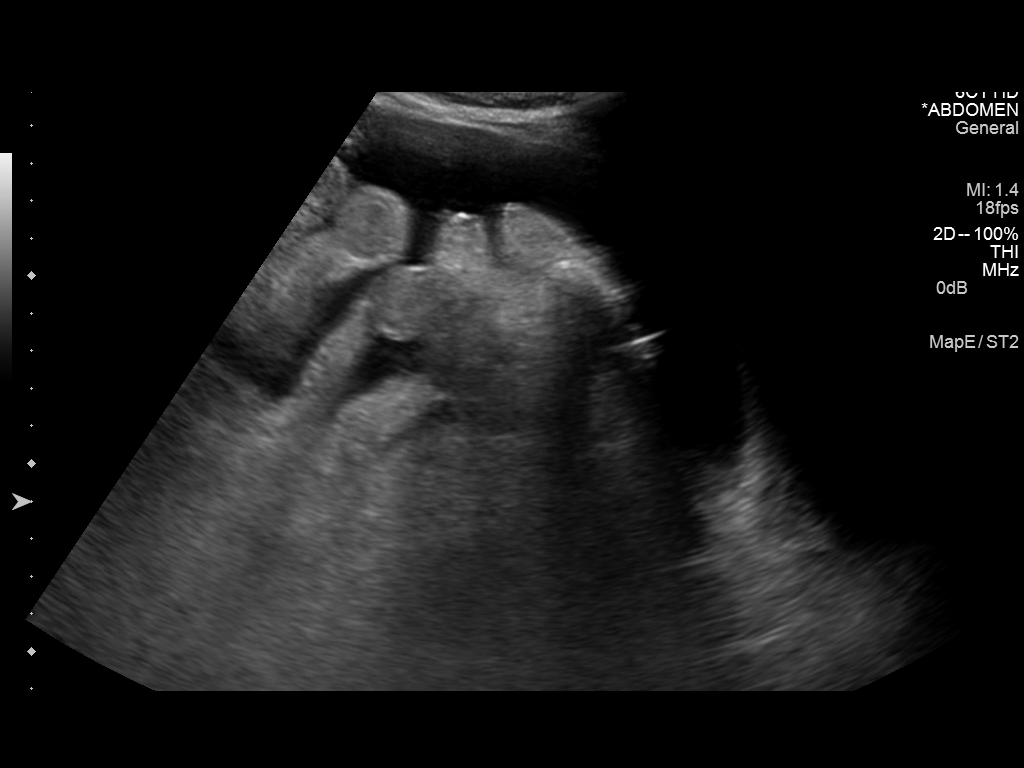
[im 4/5]
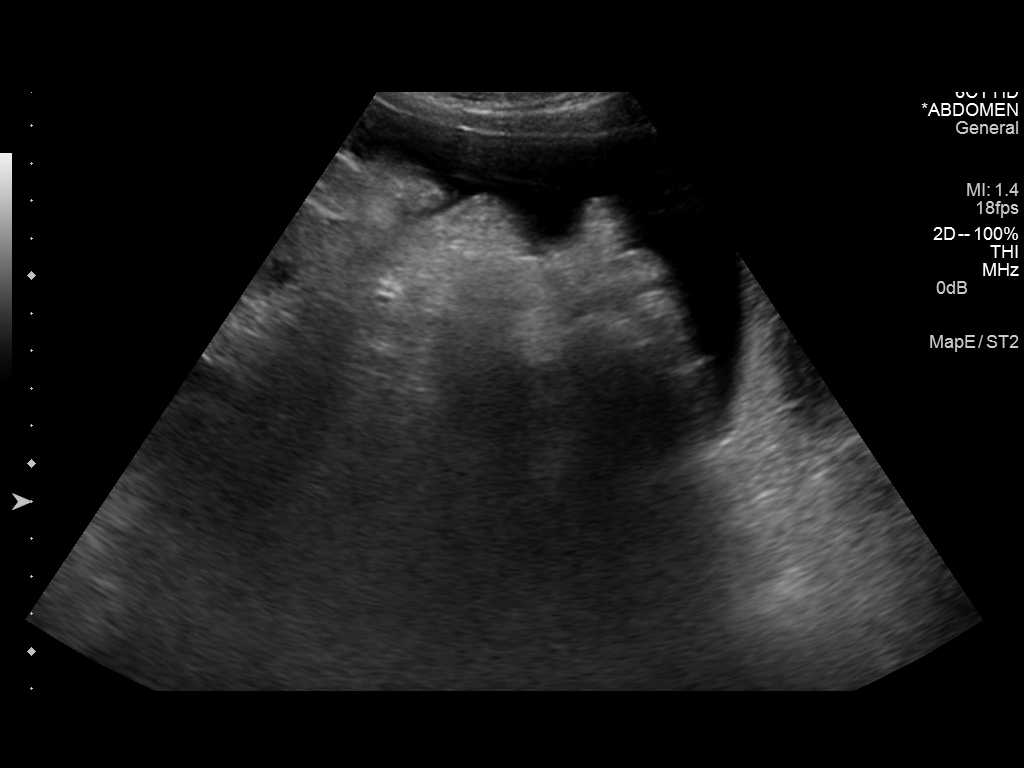
[im 5/5]
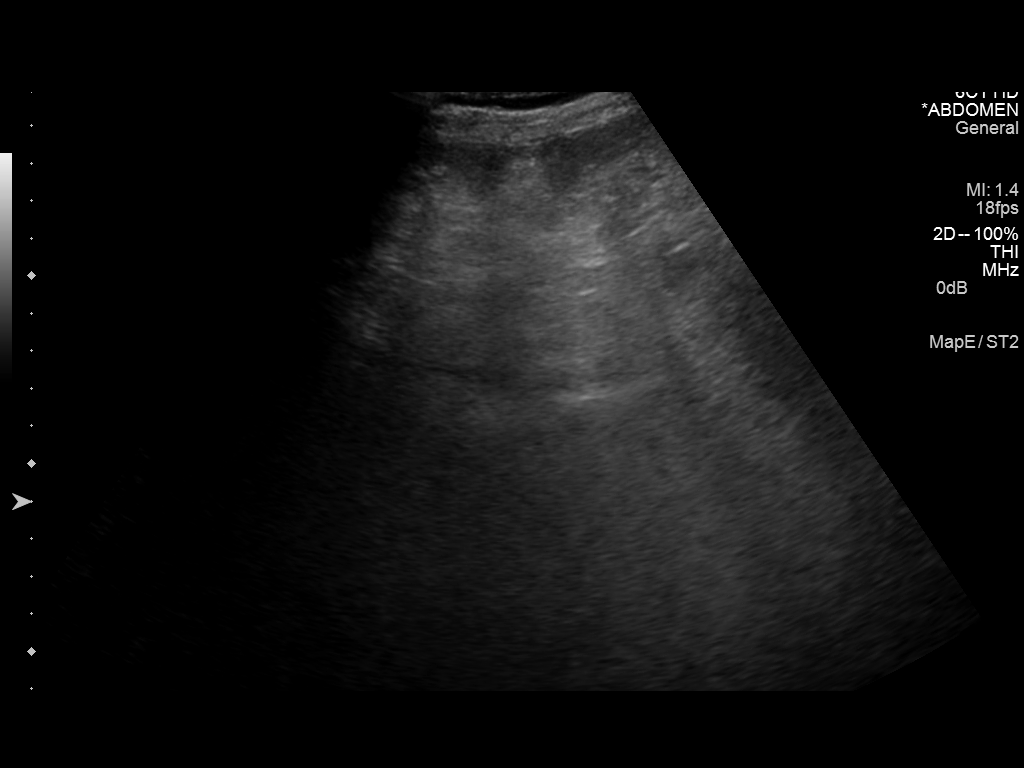

[5 of 5 positions shown; findings below may reference images not displayed]

PROCEDURE:
An ultrasound guided paracentesis was thoroughly discussed with the
patient and questions answered. The benefits, risks, alternatives
and complications were also discussed. The patient understands and
wishes to proceed with the procedure. Written consent was obtained.

Ultrasound was performed to localize and mark an adequate pocket of
fluid in the right lower quadrant of the abdomen. The area was then
prepped and draped in the normal sterile fashion. 1% Lidocaine was
used for local anesthesia. Under ultrasound guidance a 19 gauge Yueh
catheter was introduced. Paracentesis was performed. The catheter
was removed and a dressing applied.

Complications: None.
FINDINGS: A total of approximately 4.1 liters of yellow fluid was removed.
IMPRESSION: Successful ultrasound guided therapeutic paracentesis yielding
liters of ascites.

## 2017-12-03 DEATH — deceased
